# Patient Record
Sex: Female | Born: 1940 | Hispanic: No | Smoking: Never smoker
Health system: Southern US, Community
[De-identification: ages and names within clinical notes are randomized; demographics above are authoritative.]

## PROBLEM LIST (undated history)

## (undated) DIAGNOSIS — E785 Hyperlipidemia, unspecified: Secondary | ICD-10-CM

## (undated) DIAGNOSIS — I10 Essential (primary) hypertension: Secondary | ICD-10-CM

## (undated) HISTORY — DX: Essential (primary) hypertension: I10

## (undated) HISTORY — DX: Hyperlipidemia, unspecified: E78.5

---

## 1998-06-20 ENCOUNTER — Other Ambulatory Visit: Admission: RE | Admit: 1998-06-20 | Discharge: 1998-06-20 | Payer: Self-pay | Admitting: Family Medicine

## 1999-07-14 ENCOUNTER — Other Ambulatory Visit: Admission: RE | Admit: 1999-07-14 | Discharge: 1999-07-14 | Payer: Self-pay | Admitting: Family Medicine

## 2000-08-25 ENCOUNTER — Other Ambulatory Visit: Admission: RE | Admit: 2000-08-25 | Discharge: 2000-08-25 | Payer: Self-pay | Admitting: Family Medicine

## 2003-10-03 ENCOUNTER — Other Ambulatory Visit: Admission: RE | Admit: 2003-10-03 | Discharge: 2003-10-03 | Payer: Self-pay | Admitting: Family Medicine

## 2005-06-02 ENCOUNTER — Other Ambulatory Visit: Admission: RE | Admit: 2005-06-02 | Discharge: 2005-06-02 | Payer: Self-pay | Admitting: Family Medicine

## 2012-04-10 ENCOUNTER — Other Ambulatory Visit: Payer: Self-pay | Admitting: *Deleted

## 2012-05-23 ENCOUNTER — Encounter: Payer: Self-pay | Admitting: *Deleted

## 2012-06-15 ENCOUNTER — Ambulatory Visit (INDEPENDENT_AMBULATORY_CARE_PROVIDER_SITE_OTHER): Payer: Medicare Other | Admitting: Nurse Practitioner

## 2012-06-15 ENCOUNTER — Encounter: Payer: Self-pay | Admitting: Nurse Practitioner

## 2012-06-15 VITALS — BP 132/66 | HR 64 | Temp 97.7°F | Ht 65.0 in | Wt 134.0 lb

## 2012-06-15 DIAGNOSIS — E785 Hyperlipidemia, unspecified: Secondary | ICD-10-CM

## 2012-06-15 DIAGNOSIS — I10 Essential (primary) hypertension: Secondary | ICD-10-CM | POA: Insufficient documentation

## 2012-06-15 LAB — COMPLETE METABOLIC PANEL WITH GFR
ALT: 17 U/L (ref 0–35)
BUN: 13 mg/dL (ref 6–23)
CO2: 27 mEq/L (ref 19–32)
Calcium: 9.9 mg/dL (ref 8.4–10.5)
Chloride: 104 mEq/L (ref 96–112)
Creat: 0.75 mg/dL (ref 0.50–1.10)
GFR, Est African American: >89 mL/min

## 2012-06-15 NOTE — Progress Notes (Signed)
  Subjective:    Patient ID: Becky Berg, female    DOB: [birth year], 72 y.o.   MRN: [id number]  Hypertension This is a chronic problem. The current episode started more than 1 year ago. The problem is unchanged. The problem is controlled. Pertinent negatives include no blurred vision, chest pain, headaches, malaise/fatigue, palpitations, peripheral edema or shortness of breath. There are no associated agents to hypertension. Risk factors for coronary artery disease include post-menopausal state, dyslipidemia and family history. Past treatments include ACE inhibitors. The current treatment provides significant improvement. There are no compliance problems.   Hyperlipidemia This is a chronic problem. The current episode started more than 1 year ago. The problem is controlled. Recent lipid tests were reviewed and are normal. There are no known factors aggravating her hyperlipidemia. Pertinent negatives include no chest pain or shortness of breath. Current antihyperlipidemic treatment includes statins. The current treatment provides significant improvement of lipids. There are no compliance problems.  Risk factors for coronary artery disease include hypertension, post-menopausal and family history.      Review of Systems  Constitutional: Negative for malaise/fatigue.  Eyes: Negative for blurred vision.  Respiratory: Negative for shortness of breath.   Cardiovascular: Negative for chest pain and palpitations.  Neurological: Negative for headaches.  All other systems reviewed and are negative.       Objective:   Physical Exam  Constitutional: She is oriented to person, place, and time. She appears well-developed and well-nourished.  HENT:  Nose: Nose normal.  Mouth/Throat: Oropharynx is clear and moist.  Eyes: EOM are normal.  Neck: Trachea normal, normal range of motion and full passive range of motion without pain. Neck supple. No JVD present. Carotid bruit is not present. No thyromegaly  present.  Cardiovascular: Normal rate, regular rhythm, normal heart sounds and intact distal pulses.  Exam reveals no gallop and no friction rub.   No murmur heard. Pulmonary/Chest: Effort normal and breath sounds normal.  Abdominal: Soft. Bowel sounds are normal. She exhibits no distension and no mass. There is no tenderness.  Musculoskeletal: Normal range of motion.  Lymphadenopathy:    She has no cervical adenopathy.  Neurological: She is alert and oriented to person, place, and time. She has normal reflexes.  Skin: Skin is warm and dry.  Psychiatric: She has a normal mood and affect. Her behavior is normal. Judgment and thought content normal.   BP 132/66  Pulse 64  Temp(Src) 97.7 F (36.5 C) (Oral)  Ht 5\' 5"  (1.651 m)  Wt 134 lb (60.782 kg)  BMI 22.3 kg/m2        Assessment & Plan:     ICD-9-CM   1. Hypertension 401.9 COMPLETE METABOLIC PANEL WITH GFR  2. Hyperlipidemia 272.4 NMR Lipoprofile with Lipids   Continue all meds Labs pending Continue diet and excersie Follow- up in 3 months  Mary-Margaret Daphine Deutscher, FNP

## 2012-06-15 NOTE — Patient Instructions (Signed)

## 2012-06-16 ENCOUNTER — Ambulatory Visit: Payer: Self-pay | Admitting: Nurse Practitioner

## 2012-06-20 LAB — NMR LIPOPROFILE WITH LIPIDS
HDL Particle Number: 36.4 umol/L (ref >=30.5)
LDL (calc): 71 mg/dL (ref <100)
LDL Size: 20.8 nm (ref >20.5)
LP-IR Score: 26 (ref <=45)
Large HDL-P: 11.8 umol/L (ref >=4.8)
Small LDL Particle Number: 217 nmol/L (ref <=527)

## 2012-06-28 ENCOUNTER — Ambulatory Visit (INDEPENDENT_AMBULATORY_CARE_PROVIDER_SITE_OTHER): Payer: Medicare Other

## 2012-06-28 ENCOUNTER — Ambulatory Visit (INDEPENDENT_AMBULATORY_CARE_PROVIDER_SITE_OTHER): Payer: Medicare Other | Admitting: Pharmacist

## 2012-06-28 VITALS — Ht 66.0 in | Wt 134.0 lb

## 2012-06-28 DIAGNOSIS — Z780 Asymptomatic menopausal state: Secondary | ICD-10-CM

## 2012-06-28 DIAGNOSIS — Z13820 Encounter for screening for osteoporosis: Secondary | ICD-10-CM

## 2012-06-28 NOTE — Patient Instructions (Addendum)

## 2012-06-28 NOTE — Progress Notes (Signed)
Patient ID: Becky Berg, female   DOB: Jul 25, 1940, 72 y.o.   MRN: [id number] [facility] Current Height: Height: 5\' 6"  (167.6 cm)      Max Lifetime Height:  5\' 6"    Current Weight: Weight: 134 lb (60.782 kg)       Ethnicity:Caucasian     HPI: Does pt already have a diagnosis of:  Osteopenia?  No Osteoporosis?  No  Back Pain?  No       Kyphosis?  Yes - slight Prior fracture?  No Med(s) for Osteoporosis/Osteopenia:  none Med(s) previously tried for Osteoporosis/Osteopenia:  none                                                             PMH: Age at menopause:  20's Hysterectomy?  No Oophorectomy?  No HRT? Yes - Former.  Type/duration: premarin for about 1 year Steroid Use?  No Thyroid med?  No History of cancer?  No History of digestive disorders (ie Crohn's)?  No Current or previous eating disorders?  No Last Vitamin D Resuls: 50 (10/2010) Last GFR Result:  80 (06/20/2012)   FH/SH: Family history of osteoporosis?  No Parent with history of hip fracture?  No Family history of breast cancer?  Yes -sister Exercise?  No but works in her garden  Smoking?  No Alcohol?  No    Calcium Assessment Calcium Intake  # of servings/day  Calcium mg  Milk (8 oz) 2  x  300  = 600mg   Yogurt (4 oz) 0.5 x  200 = 100mg   Cheese (1 oz) 0 x  200 = 0  Other Calcium sources   250mg   Ca supplement 0 = 0   Estimated calcium intake per day 950mg     DEXA Results Date of Test T-Score for AP Spine L1-L4 T-Score for Total Left Hip T-Score for Total Right Hip  06/28/2012 -0.5 -0.4 -0.3  08/27/2009 0.1 -0.5 -0.3  05/24/2007 0.2 -0.2 -0.1  10/07/2003 -0.9 -0.3 --    Assessment: Normal BMD but decreased BMD in the spine, stable in both hips  Recommendations:  1.  recommend calcium 1200mg  daily either through supplementation   or diet.   3.  recommend weight bearing exercise - 30 minutes at least 4 days   per week.   4.  Counseled and educated about fall risk and  prevention.  Recheck DEXA:  2 years  Time spent counseling patient:  10 minutes  Henrene Pastor, PharmD, CPP

## 2012-09-11 ENCOUNTER — Telehealth: Payer: Self-pay | Admitting: Nurse Practitioner

## 2012-09-11 MED ORDER — ATORVASTATIN CALCIUM 40 MG PO TABS: 40.0000 mg | ORAL_TABLET | Freq: Every day | ORAL | Status: DC

## 2012-09-11 NOTE — Telephone Encounter (Signed)
DONE

## 2012-10-16 ENCOUNTER — Encounter: Payer: Self-pay | Admitting: Nurse Practitioner

## 2012-10-16 ENCOUNTER — Ambulatory Visit (INDEPENDENT_AMBULATORY_CARE_PROVIDER_SITE_OTHER): Payer: Medicare Other | Admitting: Nurse Practitioner

## 2012-10-16 VITALS — BP 153/75 | HR 66 | Temp 98.0°F | Ht 66.0 in | Wt 131.0 lb

## 2012-10-16 DIAGNOSIS — E785 Hyperlipidemia, unspecified: Secondary | ICD-10-CM

## 2012-10-16 DIAGNOSIS — I10 Essential (primary) hypertension: Secondary | ICD-10-CM

## 2012-10-16 MED ORDER — LISINOPRIL 40 MG PO TABS: 40.0000 mg | ORAL_TABLET | Freq: Every day | ORAL | Status: DC

## 2012-10-16 MED ORDER — ATORVASTATIN CALCIUM 40 MG PO TABS: 40.0000 mg | ORAL_TABLET | Freq: Every day | ORAL | Status: DC

## 2012-10-16 NOTE — Progress Notes (Signed)
  Subjective:    Patient ID: Becky Berg, female    DOB: July 30, 1940, 72 y.o.   MRN: [id number]  Hypertension This is a chronic problem. The current episode started more than 1 year ago. The problem is unchanged. The problem is controlled. Pertinent negatives include no blurred vision, chest pain, headaches, malaise/fatigue, palpitations, peripheral edema or shortness of breath. There are no associated agents to hypertension. Risk factors for coronary artery disease include post-menopausal state, dyslipidemia and family history. Past treatments include ACE inhibitors. The current treatment provides significant improvement. There are no compliance problems.   Hyperlipidemia This is a chronic problem. The current episode started more than 1 year ago. The problem is controlled. Recent lipid tests were reviewed and are normal. There are no known factors aggravating her hyperlipidemia. Pertinent negatives include no chest pain or shortness of breath. Current antihyperlipidemic treatment includes statins. The current treatment provides significant improvement of lipids. There are no compliance problems.  Risk factors for coronary artery disease include hypertension, post-menopausal and family history.      Review of Systems  Constitutional: Negative for malaise/fatigue.  Eyes: Negative for blurred vision.  Respiratory: Negative for shortness of breath.   Cardiovascular: Negative for chest pain and palpitations.  Neurological: Negative for headaches.  All other systems reviewed and are negative.       Objective:   Physical Exam  Constitutional: She is oriented to person, place, and time. She appears well-developed and well-nourished.  HENT:  Nose: Nose normal.  Mouth/Throat: Oropharynx is clear and moist.  Eyes: EOM are normal.  Neck: Trachea normal, normal range of motion and full passive range of motion without pain. Neck supple. No JVD present. Carotid bruit is not present. No thyromegaly  present.  Cardiovascular: Normal rate, regular rhythm, normal heart sounds and intact distal pulses.  Exam reveals no gallop and no friction rub.   No murmur heard. Pulmonary/Chest: Effort normal and breath sounds normal.  Abdominal: Soft. Bowel sounds are normal. She exhibits no distension and no mass. There is no tenderness.  Musculoskeletal: Normal range of motion.  Lymphadenopathy:    She has no cervical adenopathy.  Neurological: She is alert and oriented to person, place, and time. She has normal reflexes.  Skin: Skin is warm and dry.  Psychiatric: She has a normal mood and affect. Her behavior is normal. Judgment and thought content normal.   BP 153/75  Pulse 66  Temp(Src) 98 F (36.7 C) (Oral)  Ht 5\' 6"  (1.676 m)  Wt 131 lb (59.421 kg)  BMI 21.15 kg/m2        Assessment & Plan:   1. Hypertension   2. Hyperlipidemia    Orders Placed This Encounter  Procedures  . CMP14+EGFR  . NMR, lipoprofile   Meds ordered this encounter  Medications  . lisinopril (PRINIVIL,ZESTRIL) 40 MG tablet    Sig: Take 1 tablet (40 mg total) by mouth daily.    Dispense:  90 tablet    Refill:  1  . atorvastatin (LIPITOR) 40 MG tablet    Sig: Take 1 tablet (40 mg total) by mouth daily.    Dispense:  90 tablet    Refill:  1    Continue all meds Labs pending Continue diet and excersie Follow- up in 3 months  Mary-Margaret Daphine Deutscher, FNP

## 2012-10-16 NOTE — Patient Instructions (Signed)

## 2012-10-18 LAB — CMP14+EGFR
ALT: 15 IU/L (ref 0–32)
AST: 19 IU/L (ref 0–40)
Albumin/Globulin Ratio: 2.3 (ref 1.1–2.5)
Alkaline Phosphatase: 63 IU/L (ref 39–117)
BUN/Creatinine Ratio: 16 (ref 11–26)
GFR calc Af Amer: 85 mL/min/{1.73_m2} (ref >59)
GFR calc non Af Amer: 73 mL/min/{1.73_m2} (ref >59)
Potassium: 4.8 mmol/L (ref 3.5–5.2)
Sodium: 141 mmol/L (ref 134–144)
Total Bilirubin: 0.5 mg/dL (ref 0.0–1.2)

## 2012-10-18 LAB — NMR, LIPOPROFILE
Cholesterol: 176 mg/dL (ref <200)
HDL Particle Number: 34.7 umol/L (ref >=30.5)
LDL Size: 20.9 nm (ref >20.5)
LDLC SERPL CALC-MCNC: 83 mg/dL (ref <100)
LP-IR Score: 46 — ABNORMAL HIGH (ref <=45)
Small LDL Particle Number: 388 nmol/L (ref <=527)
Triglycerides by NMR: 141 mg/dL (ref <150)

## 2013-04-25 ENCOUNTER — Telehealth: Payer: Self-pay | Admitting: Family Medicine

## 2013-04-26 NOTE — Telephone Encounter (Signed)
appt given for monday

## 2013-04-30 ENCOUNTER — Ambulatory Visit (INDEPENDENT_AMBULATORY_CARE_PROVIDER_SITE_OTHER): Payer: Medicare Other | Admitting: Nurse Practitioner

## 2013-04-30 ENCOUNTER — Encounter: Payer: Self-pay | Admitting: Nurse Practitioner

## 2013-04-30 VITALS — BP 148/76 | HR 74 | Temp 96.6°F | Ht 66.0 in | Wt 137.0 lb

## 2013-04-30 DIAGNOSIS — N812 Incomplete uterovaginal prolapse: Secondary | ICD-10-CM

## 2013-04-30 DIAGNOSIS — N814 Uterovaginal prolapse, unspecified: Secondary | ICD-10-CM

## 2013-04-30 NOTE — Progress Notes (Signed)
   Subjective:    Patient ID: Becky Berg, female    DOB: September 18, 1940, 73 y.o.   MRN: [id number]  HPI Patient said last week she noticed she was loosing blood from private area- [place] to [place] in [place]- They said that she has bladder and uterine prolapse. SH ehas not sen anymore blood since then.    Review of Systems  Constitutional: Negative.   HENT: Negative.   Respiratory: Negative.   Cardiovascular: Negative.   Genitourinary: Positive for vaginal bleeding.  Neurological: Negative.   Psychiatric/Behavioral: Negative.   All other systems reviewed and are negative.       Objective:   Physical Exam  Constitutional: She appears well-developed and well-nourished.  Cardiovascular: Normal rate, regular rhythm and normal heart sounds.   Pulmonary/Chest: Effort normal and breath sounds normal.  Abdominal: Soft. Bowel sounds are normal.  Genitourinary:  Uterine prolapse- station 2 No bleeding noted  Neurological: She is alert.  Skin: Skin is warm.    BP 148/76  Pulse 74  Temp(Src) 96.6 F (35.9 C) (Oral)  Ht 5\' 6"  (1.676 m)  Wt 137 lb (62.143 kg)  BMI 22.12 kg/m2       Assessment & Plan:   1. Second degree uterine prolapse    Discussed options with patient- decided to just watch for now Bleeding was coming from rectum- small internal hemorrhoids Needs to make appointment for regular follow up  Mary-Margaret Daphine DeutscherMartin, FNP

## 2013-05-07 ENCOUNTER — Encounter: Payer: Self-pay | Admitting: Nurse Practitioner

## 2013-05-07 ENCOUNTER — Ambulatory Visit (INDEPENDENT_AMBULATORY_CARE_PROVIDER_SITE_OTHER): Payer: Medicare Other | Admitting: Nurse Practitioner

## 2013-05-07 VITALS — BP 143/74 | HR 64 | Temp 98.4°F | Ht 66.0 in | Wt 137.0 lb

## 2013-05-07 DIAGNOSIS — E785 Hyperlipidemia, unspecified: Secondary | ICD-10-CM

## 2013-05-07 DIAGNOSIS — I10 Essential (primary) hypertension: Secondary | ICD-10-CM

## 2013-05-07 MED ORDER — LISINOPRIL 40 MG PO TABS: 40.0000 mg | ORAL_TABLET | Freq: Every day | ORAL | Status: DC

## 2013-05-07 MED ORDER — ATORVASTATIN CALCIUM 40 MG PO TABS: 40.0000 mg | ORAL_TABLET | Freq: Every day | ORAL | Status: DC

## 2013-05-07 NOTE — Patient Instructions (Signed)

## 2013-05-07 NOTE — Progress Notes (Signed)
Subjective:    Patient ID: Collins Scotlandolly W Kluender, female    DOB: [birth year], 73 y.o.   MRN: [id number]  Patient here today for follow up of chronic medical problems.  Hypertension This is a chronic problem. The current episode started more than 1 year ago. The problem is unchanged. The problem is controlled. Pertinent negatives include no blurred vision, chest pain, headaches, malaise/fatigue, palpitations, peripheral edema or shortness of breath. There are no associated agents to hypertension. Risk factors for coronary artery disease include post-menopausal state, dyslipidemia and family history. Past treatments include ACE inhibitors. The current treatment provides significant improvement. There are no compliance problems.   Hyperlipidemia This is a chronic problem. The current episode started more than 1 year ago. The problem is controlled. Recent lipid tests were reviewed and are normal. There are no known factors aggravating her hyperlipidemia. Pertinent negatives include no chest pain or shortness of breath. Current antihyperlipidemic treatment includes statins. The current treatment provides significant improvement of lipids. There are no compliance problems.  Risk factors for coronary artery disease include hypertension, post-menopausal and family history.      Review of Systems  Constitutional: Negative for malaise/fatigue.  Eyes: Negative for blurred vision.  Respiratory: Negative for shortness of breath.   Cardiovascular: Negative for chest pain and palpitations.  Neurological: Negative for headaches.  All other systems reviewed and are negative.      Objective:   Physical Exam  Constitutional: She is oriented to person, place, and time. She appears well-developed and well-nourished.  HENT:  Nose: Nose normal.  Mouth/Throat: Oropharynx is clear and moist.  Eyes: EOM are normal.  Neck: Trachea normal, normal range of motion and full passive range of motion without pain. Neck  supple. No JVD present. Carotid bruit is not present. No thyromegaly present.  Cardiovascular: Normal rate, regular rhythm, normal heart sounds and intact distal pulses.  Exam reveals no gallop and no friction rub.   No murmur heard. Pulmonary/Chest: Effort normal and breath sounds normal.  Abdominal: Soft. Bowel sounds are normal. She exhibits no distension and no mass. There is no tenderness.  Musculoskeletal: Normal range of motion.  Lymphadenopathy:    She has no cervical adenopathy.  Neurological: She is alert and oriented to person, place, and time. She has normal reflexes.  Skin: Skin is warm and dry.  Psychiatric: She has a normal mood and affect. Her behavior is normal. Judgment and thought content normal.   BP 143/74  Pulse 64  Temp(Src) 98.4 F (36.9 C) (Oral)  Ht 5\' 6"  (1.676 m)  Wt 137 lb (62.143 kg)  BMI 22.12 kg/m2        Assessment & Plan:    1. Hypertension   2. Hyperlipidemia    Orders Placed This Encounter  Procedures  . CMP14+EGFR  . NMR, lipoprofile   Meds ordered this encounter  Medications  . atorvastatin (LIPITOR) 40 MG tablet    Sig: Take 1 tablet (40 mg total) by mouth daily.    Dispense:  90 tablet    Refill:  1    Order Specific Question:  Supervising Provider    Answer:  Ernestina PennaMOORE, DONALD W [[id number]]  . lisinopril (PRINIVIL,ZESTRIL) 40 MG tablet    Sig: Take 1 tablet (40 mg total) by mouth daily.    Dispense:  90 tablet    Refill:  1    Order Specific Question:  Supervising Provider    Answer:  Ernestina PennaMOORE, DONALD W [[id number]]    Labs pending Health maintenance reviewed  Diet and exercise encouraged Continue all meds Follow up  In 3 months  [place], FNP

## 2013-05-08 LAB — CMP14+EGFR
ALBUMIN: 4.4 g/dL (ref 3.5–4.8)
ALK PHOS: 62 IU/L (ref 39–117)
ALT: 103 IU/L — ABNORMAL HIGH (ref 0–32)
AST: 69 IU/L — ABNORMAL HIGH (ref 0–40)
Albumin/Globulin Ratio: 1.8 (ref 1.1–2.5)
BUN / CREAT RATIO: 21 (ref 11–26)
BUN: 15 mg/dL (ref 8–27)
CHLORIDE: 100 mmol/L (ref 97–108)
CO2: 26 mmol/L (ref 18–29)
CREATININE: 0.72 mg/dL (ref 0.57–1.00)
Calcium: 9.4 mg/dL (ref 8.7–10.3)
GFR calc non Af Amer: 84 mL/min/{1.73_m2} (ref >59)
GFR, EST AFRICAN AMERICAN: 97 mL/min/{1.73_m2} (ref >59)
GLOBULIN, TOTAL: 2.5 g/dL (ref 1.5–4.5)
GLUCOSE: 89 mg/dL (ref 65–99)
Potassium: 4.8 mmol/L (ref 3.5–5.2)
Sodium: 141 mmol/L (ref 134–144)
TOTAL PROTEIN: 6.9 g/dL (ref 6.0–8.5)
Total Bilirubin: 0.5 mg/dL (ref 0.0–1.2)

## 2013-05-08 LAB — NMR, LIPOPROFILE
CHOLESTEROL: 173 mg/dL (ref <200)
HDL Cholesterol by NMR: 70 mg/dL (ref >=40)
HDL PARTICLE NUMBER: 36.5 umol/L (ref >=30.5)
LDL PARTICLE NUMBER: 966 nmol/L (ref <1000)
LDL Size: 20.7 nm (ref >20.5)
LDLC SERPL CALC-MCNC: 85 mg/dL (ref <100)
LP-IR SCORE: 25 (ref <=45)
Small LDL Particle Number: 376 nmol/L (ref <=527)
TRIGLYCERIDES BY NMR: 90 mg/dL (ref <150)

## 2013-08-03 LAB — HM MAMMOGRAPHY

## 2013-09-03 ENCOUNTER — Ambulatory Visit (INDEPENDENT_AMBULATORY_CARE_PROVIDER_SITE_OTHER): Payer: Medicare Other | Admitting: Nurse Practitioner

## 2013-09-03 ENCOUNTER — Ambulatory Visit (INDEPENDENT_AMBULATORY_CARE_PROVIDER_SITE_OTHER): Payer: Medicare Other

## 2013-09-03 ENCOUNTER — Encounter: Payer: Self-pay | Admitting: Nurse Practitioner

## 2013-09-03 VITALS — BP 144/77 | HR 74 | Temp 97.6°F | Ht 66.0 in | Wt 136.4 lb

## 2013-09-03 DIAGNOSIS — Z9189 Other specified personal risk factors, not elsewhere classified: Secondary | ICD-10-CM

## 2013-09-03 DIAGNOSIS — E785 Hyperlipidemia, unspecified: Secondary | ICD-10-CM

## 2013-09-03 DIAGNOSIS — Z7722 Contact with and (suspected) exposure to environmental tobacco smoke (acute) (chronic): Secondary | ICD-10-CM

## 2013-09-03 DIAGNOSIS — I10 Essential (primary) hypertension: Secondary | ICD-10-CM

## 2013-09-03 MED ORDER — LISINOPRIL 40 MG PO TABS: 40.0000 mg | ORAL_TABLET | Freq: Every day | ORAL | Status: DC

## 2013-09-03 MED ORDER — ATORVASTATIN CALCIUM 40 MG PO TABS: 40.0000 mg | ORAL_TABLET | Freq: Every day | ORAL | Status: DC

## 2013-09-03 NOTE — Progress Notes (Signed)
Subjective:    Patient ID: Becky Berg, female    DOB: Feb 06, 1940, 73 y.o.   MRN: [id number]  Patient here today for follow up of chronic medical problems. No compliants today.  Hypertension This is a chronic problem. The current episode started more than 1 year ago. The problem is unchanged. The problem is controlled. Pertinent negatives include no blurred vision, chest pain, headaches, malaise/fatigue, palpitations, peripheral edema or shortness of breath. There are no associated agents to hypertension. Risk factors for coronary artery disease include post-menopausal state, dyslipidemia and family history. Past treatments include ACE inhibitors. The current treatment provides significant improvement. There are no compliance problems.   Hyperlipidemia This is a chronic problem. The current episode started more than 1 year ago. The problem is controlled. Recent lipid tests were reviewed and are normal. There are no known factors aggravating her hyperlipidemia. Pertinent negatives include no chest pain or shortness of breath. Current antihyperlipidemic treatment includes statins. The current treatment provides significant improvement of lipids. There are no compliance problems.  Risk factors for coronary artery disease include hypertension, post-menopausal and family history.      Review of Systems  Constitutional: Negative for malaise/fatigue.  Eyes: Negative for blurred vision.  Respiratory: Negative for shortness of breath.   Cardiovascular: Negative for chest pain and palpitations.  Neurological: Negative for headaches.  All other systems reviewed and are negative.      Objective:   Physical Exam  Constitutional: She is oriented to person, place, and time. She appears well-developed and well-nourished.  HENT:  Nose: Nose normal.  Mouth/Throat: Oropharynx is clear and moist.  Eyes: EOM are normal.  Neck: Trachea normal, normal range of motion and full passive range of motion  without pain. Neck supple. No JVD present. Carotid bruit is not present. No thyromegaly present.  Cardiovascular: Normal rate, regular rhythm, normal heart sounds and intact distal pulses.  Exam reveals no gallop and no friction rub.   No murmur heard. Pulmonary/Chest: Effort normal and breath sounds normal.  Abdominal: Soft. Bowel sounds are normal. She exhibits no distension and no mass. There is no tenderness.  Musculoskeletal: Normal range of motion.  Lymphadenopathy:    She has no cervical adenopathy.  Neurological: She is alert and oriented to person, place, and time. She has normal reflexes.  Skin: Skin is warm and dry.  Psychiatric: She has a normal mood and affect. Her behavior is normal. Judgment and thought content normal.   BP 144/77  Pulse 74  Temp(Src) 97.6 F (36.4 C) (Oral)  Ht _0  (1.676 m)  Wt 136 lb 6.4 oz (61.871 kg)  BMI 22.03 kg/m2 Chest x ray- Chronic bronchitic changes-Preliminary reading by Ronnald Collum, FNP  [facility]  EKG- Kerry Hough, FNP       Assessment & Plan:   1. Essential hypertension   2. Hyperlipidemia    Orders Placed This Encounter  Procedures  . CMP14+EGFR  . NMR, lipoprofile   Meds ordered this encounter  Medications  . atorvastatin (LIPITOR) 40 MG tablet    Sig: Take 1 tablet (40 mg total) by mouth daily.    Dispense:  90 tablet    Refill:  1    Order Specific Question:  Supervising Provider    Answer:  Chipper Herb [[id number]]  . lisinopril (PRINIVIL,ZESTRIL) 40 MG tablet    Sig: Take 1 tablet (40 mg total) by mouth daily.    Dispense:  90 tablet    Refill:  1    Order  Specific Question:  Supervising Provider    Answer:  Chipper Herb [[id number]]   hemoccult cards given to patient with directions Labs pending Health maintenance reviewed Diet and exercise encouraged Continue all meds Follow up  In 3 months   [place], FNP

## 2013-09-03 NOTE — Patient Instructions (Signed)

## 2013-09-04 LAB — CMP14+EGFR
A/G RATIO: 1.8 (ref 1.1–2.5)
ALK PHOS: 61 IU/L (ref 39–117)
ALT: 112 IU/L — AB (ref 0–32)
AST: 83 IU/L — AB (ref 0–40)
Albumin: 4.4 g/dL (ref 3.5–4.8)
BUN / CREAT RATIO: 16 (ref 11–26)
BUN: 12 mg/dL (ref 8–27)
CO2: 25 mmol/L (ref 18–29)
CREATININE: 0.73 mg/dL (ref 0.57–1.00)
Calcium: 9.7 mg/dL (ref 8.7–10.3)
Chloride: 99 mmol/L (ref 97–108)
GFR calc Af Amer: 95 mL/min/{1.73_m2} (ref >59)
GFR calc non Af Amer: 83 mL/min/{1.73_m2} (ref >59)
GLOBULIN, TOTAL: 2.5 g/dL (ref 1.5–4.5)
Glucose: 99 mg/dL (ref 65–99)
POTASSIUM: 4.8 mmol/L (ref 3.5–5.2)
SODIUM: 142 mmol/L (ref 134–144)
Total Bilirubin: 0.5 mg/dL (ref 0.0–1.2)
Total Protein: 6.9 g/dL (ref 6.0–8.5)

## 2013-09-04 LAB — NMR, LIPOPROFILE
Cholesterol: 177 mg/dL (ref 100–199)
HDL Cholesterol by NMR: 74 mg/dL (ref >39)
HDL PARTICLE NUMBER: 38.8 umol/L (ref >=30.5)
LDL Particle Number: 730 nmol/L (ref <1000)
LDL Size: 21.1 nm (ref >20.5)
LDLC SERPL CALC-MCNC: 85 mg/dL (ref 0–99)
LP-IR Score: 27 (ref <=45)
Triglycerides by NMR: 89 mg/dL (ref 0–149)

## 2013-11-07 ENCOUNTER — Telehealth: Payer: Self-pay | Admitting: Nurse Practitioner

## 2013-11-07 DIAGNOSIS — I10 Essential (primary) hypertension: Secondary | ICD-10-CM

## 2013-11-07 DIAGNOSIS — E785 Hyperlipidemia, unspecified: Secondary | ICD-10-CM

## 2013-11-08 MED ORDER — ATORVASTATIN CALCIUM 40 MG PO TABS: 40.0000 mg | ORAL_TABLET | Freq: Every day | ORAL | Status: AC

## 2013-11-08 MED ORDER — LISINOPRIL 40 MG PO TABS: 40.0000 mg | ORAL_TABLET | Freq: Every day | ORAL | Status: AC

## 2013-11-08 NOTE — Telephone Encounter (Signed)
rx snet

## 2013-12-13 ENCOUNTER — Encounter: Payer: Self-pay | Admitting: Nurse Practitioner

## 2013-12-13 ENCOUNTER — Telehealth: Payer: Self-pay | Admitting: *Deleted

## 2013-12-13 ENCOUNTER — Ambulatory Visit (INDEPENDENT_AMBULATORY_CARE_PROVIDER_SITE_OTHER): Payer: Medicare Other | Admitting: Nurse Practitioner

## 2013-12-13 VITALS — BP 148/78 | HR 90 | Temp 98.4°F | Ht 66.0 in | Wt 138.0 lb

## 2013-12-13 DIAGNOSIS — E785 Hyperlipidemia, unspecified: Secondary | ICD-10-CM

## 2013-12-13 DIAGNOSIS — I10 Essential (primary) hypertension: Secondary | ICD-10-CM

## 2013-12-13 DIAGNOSIS — J209 Acute bronchitis, unspecified: Secondary | ICD-10-CM

## 2013-12-13 MED ORDER — BENZONATATE 100 MG PO CAPS: 100.0000 mg | ORAL_CAPSULE | Freq: Two times a day (BID) | ORAL | Status: AC | PRN

## 2013-12-13 MED ORDER — PREDNISONE 20 MG PO TABS: 20.0000 mg | ORAL_TABLET | Freq: Every day | ORAL | Status: AC

## 2013-12-13 NOTE — Patient Instructions (Signed)
Cough, Adult  A cough is a reflex that helps clear your throat and airways. It can help heal the body or may be a reaction to an irritated airway. A cough may only last 2 or 3 weeks (acute) or may last more than 8 weeks (chronic).  CAUSES Acute cough:  Viral or bacterial infections. Chronic cough:  Infections.  Allergies.  Asthma.  Post-nasal drip.  Smoking.  Heartburn or acid reflux.  Some medicines.  Chronic lung problems (COPD).  Cancer. SYMPTOMS   Cough.  Fever.  Chest pain.  Increased breathing rate.  High-pitched whistling sound when breathing (wheezing).  Colored mucus that you cough up (sputum). TREATMENT   A bacterial cough may be treated with antibiotic medicine.  A viral cough must run its course and will not respond to antibiotics.  Your caregiver may recommend other treatments if you have a chronic cough. HOME CARE INSTRUCTIONS   Only take over-the-counter or prescription medicines for pain, discomfort, or fever as directed by your caregiver. Use cough suppressants only as directed by your caregiver.  Use a cold steam vaporizer or humidifier in your bedroom or home to help loosen secretions.  Sleep in a semi-upright position if your cough is worse at night.  Rest as needed.  Stop smoking if you smoke. SEEK IMMEDIATE MEDICAL CARE IF:   You have pus in your sputum.  Your cough starts to worsen.  You cannot control your cough with suppressants and are losing sleep.  You begin coughing up blood.  You have difficulty breathing.  You develop pain which is getting worse or is uncontrolled with medicine.  You have a fever. MAKE SURE YOU:   Understand these instructions.  Will watch your condition.  Will get help right away if you are not doing well or get worse. Document Released: 07/10/2010 Document Revised: 04/05/2011 Document Reviewed: 07/10/2010 Exit[employer] Patient Information 2015 [employer]. This information is not intended  to replace advice given to you by your health care provider. Make sure you discuss any questions you have with your health care provider.  

## 2013-12-13 NOTE — Progress Notes (Signed)
Subjective:    Patient ID: Becky Berg, female    DOB: [birth year], 73 y.o.   MRN: [id number]  Patient here today for follow up of chronic medical problems.  C/O cough and congestion that started at least 1 month ago- gets better then comes right back- started this last time 2 days ago and cannot quit coughing.  Hypertension This is a chronic problem. The current episode started more than 1 year ago. The problem is unchanged. The problem is controlled. Pertinent negatives include no chest pain, headaches, palpitations or shortness of breath. Risk factors for coronary artery disease include dyslipidemia and post-menopausal state. Past treatments include ACE inhibitors. The current treatment provides moderate improvement. Compliance problems include diet and exercise.   Hyperlipidemia The current episode started more than 1 year ago. The problem is controlled. Recent lipid tests were reviewed and are normal. She has no history of diabetes, hypothyroidism or obesity. Pertinent negatives include no chest pain or shortness of breath. Current antihyperlipidemic treatment includes statins. The current treatment provides moderate improvement of lipids. Compliance problems include adherence to diet and adherence to exercise.  Risk factors for coronary artery disease include dyslipidemia, hypertension and post-menopausal.      Review of Systems  Constitutional: Negative.  Negative for fever, chills and appetite change.  HENT: Positive for congestion. Negative for sinus pressure, sneezing, sore throat and trouble swallowing.   Respiratory: Positive for cough (dry cough). Negative for shortness of breath.   Cardiovascular: Negative for chest pain and palpitations.  Gastrointestinal: Negative.   Genitourinary: Negative.   Musculoskeletal: Negative.   Neurological: Negative for headaches.  Psychiatric/Behavioral: Negative.   All other systems reviewed and are negative.      Objective:   Physical  Exam  Constitutional: She is oriented to person, place, and time. She appears well-developed and well-nourished.  HENT:  Nose: Nose normal.  Mouth/Throat: Oropharynx is clear and moist.  Eyes: EOM are normal.  Neck: Trachea normal, normal range of motion and full passive range of motion without pain. Neck supple. No JVD present. Carotid bruit is not present. No thyromegaly present.  Cardiovascular: Normal rate, regular rhythm, normal heart sounds and intact distal pulses.  Exam reveals no gallop and no friction rub.   No murmur heard. Pulmonary/Chest: Effort normal and breath sounds normal. She has no wheezes. She has no rales.  Dry hacky cough  Abdominal: Soft. Bowel sounds are normal. She exhibits no distension and no mass. There is no tenderness.  Musculoskeletal: Normal range of motion.  Lymphadenopathy:    She has no cervical adenopathy.  Neurological: She is alert and oriented to person, place, and time. She has normal reflexes.  Skin: Skin is warm and dry.  Psychiatric: She has a normal mood and affect. Her behavior is normal. Judgment and thought content normal.   BP 148/78 mmHg  Pulse 90  Temp(Src) 98.4 F (36.9 C) (Oral)  Ht $R'5\' 6"'[place]$  (1.676 m)  Wt 138 lb (62.596 kg)  BMI 22.28 kg/m2       Assessment & Plan:     1. Essential hypertension Do not add salt to det - CMP14+EGFR  2. Hyperlipidemia Low fat diet - NMR, lipoprofile  3. Acute bronchitis, unspecified organism Force fluids rest - predniSONE (DELTASONE) 20 MG tablet; Take 1 tablet (20 mg total) by mouth daily with breakfast.  Dispense: 10 tablet; Refill: 0 - benzonatate (TESSALON) 100 MG capsule; Take 1 capsule (100 mg total) by mouth 2 (two) times daily as needed for cough.  Dispense: 20 capsule; Refill: 0    Labs pending Health maintenance reviewed Diet and exercise encouraged Continue all meds Follow up  In 3 months   [place], FNP

## 2013-12-13 NOTE — Telephone Encounter (Signed)
Called patient to come in to pick up FOBT kit and have it done at no charge due to lab error

## 2013-12-14 ENCOUNTER — Telehealth: Payer: Self-pay

## 2013-12-14 LAB — NMR, LIPOPROFILE
Cholesterol: 185 mg/dL (ref 100–199)
HDL Cholesterol by NMR: 82 mg/dL (ref >39)
HDL Particle Number: 37.2 umol/L (ref >=30.5)
LDL Particle Number: 953 nmol/L (ref <1000)
LDL SIZE: 21.1 nm (ref >20.5)
LDL-C: 88 mg/dL (ref 0–99)
LP-IR Score: <25 (ref <=45)
TRIGLYCERIDES BY NMR: 75 mg/dL (ref 0–149)

## 2013-12-14 LAB — CMP14+EGFR
A/G RATIO: 1.7 (ref 1.1–2.5)
ALK PHOS: 66 IU/L (ref 39–117)
ALT: 53 IU/L — AB (ref 0–32)
AST: 48 IU/L — ABNORMAL HIGH (ref 0–40)
Albumin: 4.6 g/dL (ref 3.5–4.8)
BILIRUBIN TOTAL: 0.7 mg/dL (ref 0.0–1.2)
BUN / CREAT RATIO: 13 (ref 11–26)
BUN: 11 mg/dL (ref 8–27)
CO2: 27 mmol/L (ref 18–29)
Calcium: 9.6 mg/dL (ref 8.7–10.3)
Chloride: 97 mmol/L (ref 97–108)
Creatinine, Ser: 0.83 mg/dL (ref 0.57–1.00)
GFR calc non Af Amer: 70 mL/min/{1.73_m2} (ref >59)
GFR, EST AFRICAN AMERICAN: 81 mL/min/{1.73_m2} (ref >59)
GLUCOSE: 131 mg/dL — AB (ref 65–99)
Globulin, Total: 2.7 g/dL (ref 1.5–4.5)
POTASSIUM: 4.4 mmol/L (ref 3.5–5.2)
SODIUM: 138 mmol/L (ref 134–144)
TOTAL PROTEIN: 7.3 g/dL (ref 6.0–8.5)

## 2013-12-14 NOTE — Telephone Encounter (Signed)
Pt aware of results 

## 2013-12-14 NOTE — Telephone Encounter (Signed)
-----   Message from [facility], FNP sent at 12/14/2013 12:37 PM EST ----- Kidney and liver function stable Blood sugar elevated - please add HGBA1c Cholesterol looks good Continue current meds- low fat diet and exercise and recheck in 3 months

## 2014-08-26 ENCOUNTER — Encounter: Payer: Self-pay | Admitting: *Deleted

## 2014-09-10 ENCOUNTER — Telehealth: Payer: Self-pay | Admitting: *Deleted

## 2014-09-10 NOTE — Telephone Encounter (Signed)
Left Message for patient to call back regarding follow up appointment on elevated BP

## 2016-05-21 IMAGING — CR DG CHEST 2V
2 series · 2 of 2 positions shown · non-contrast
Comparison: None.

CLINICAL DATA: Hypertension and hyperlipidemia and second hand
smoke exposure ; physical examination

EXAM:
CHEST  2 VIEW

[view not recorded (1 of 2)]
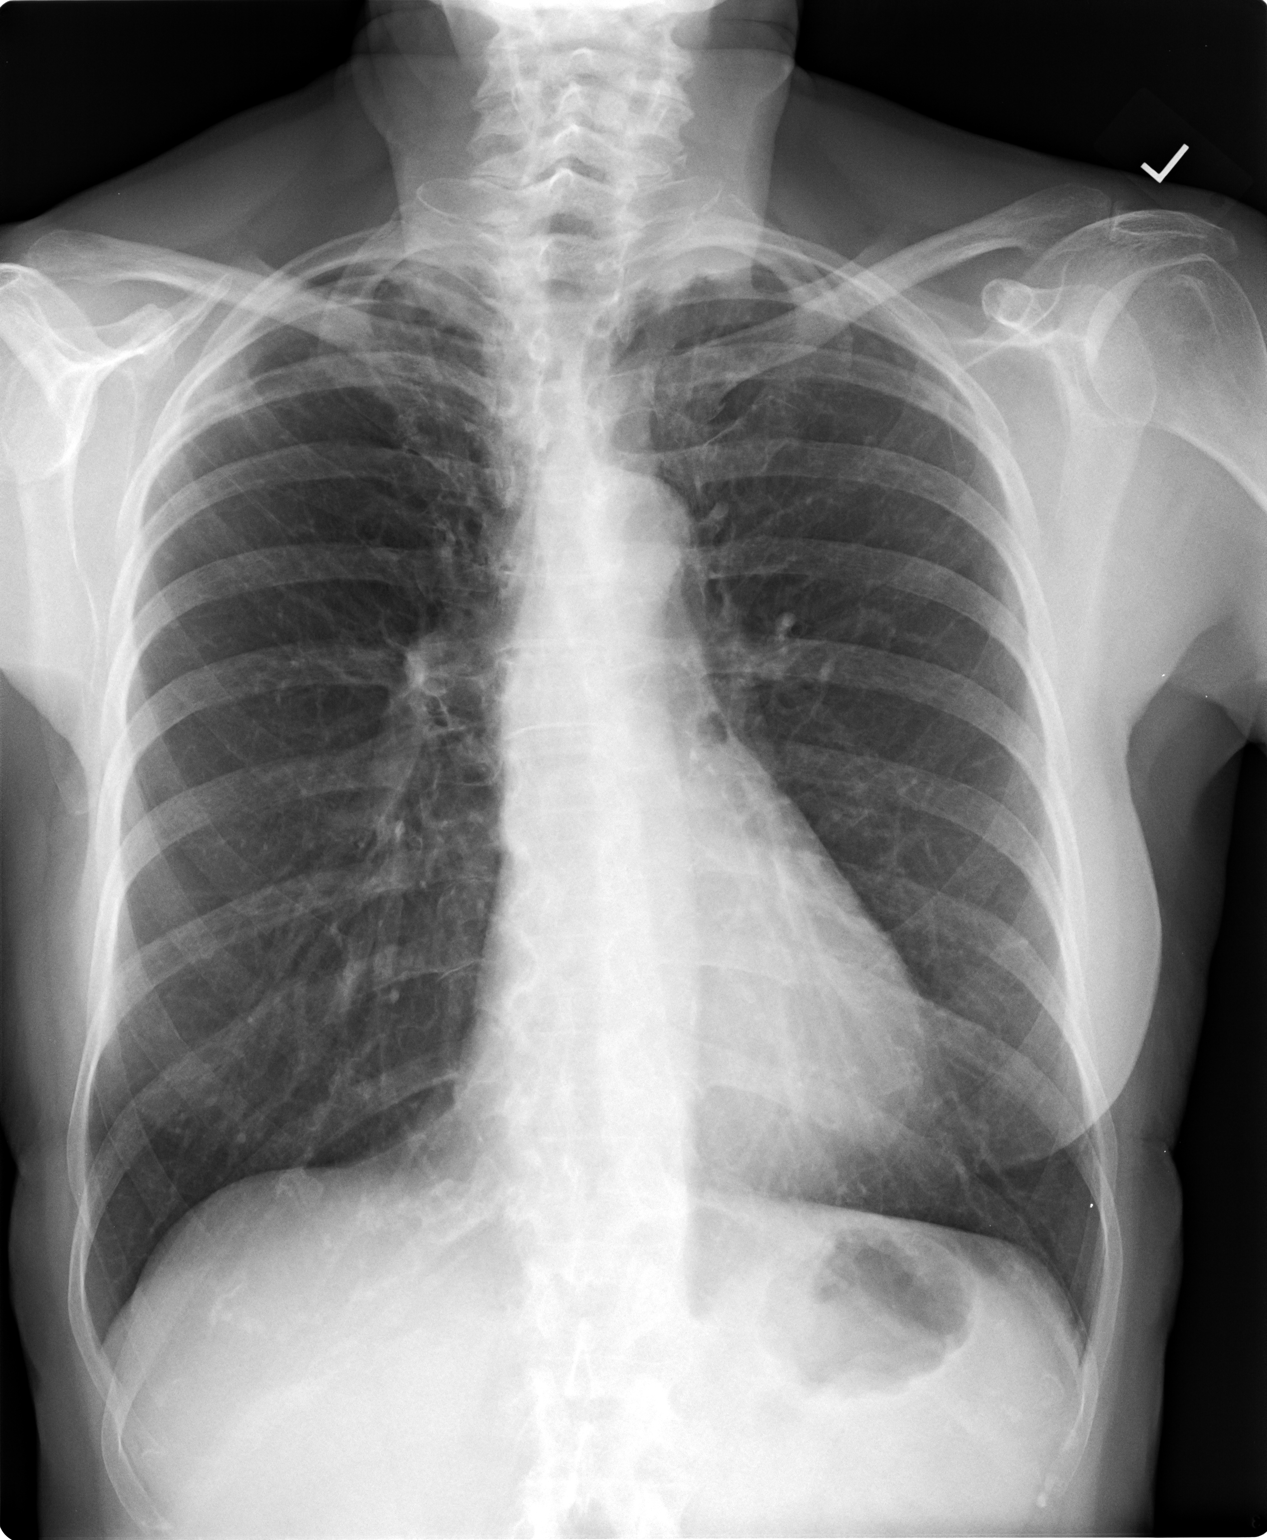

[view not recorded (2 of 2)]
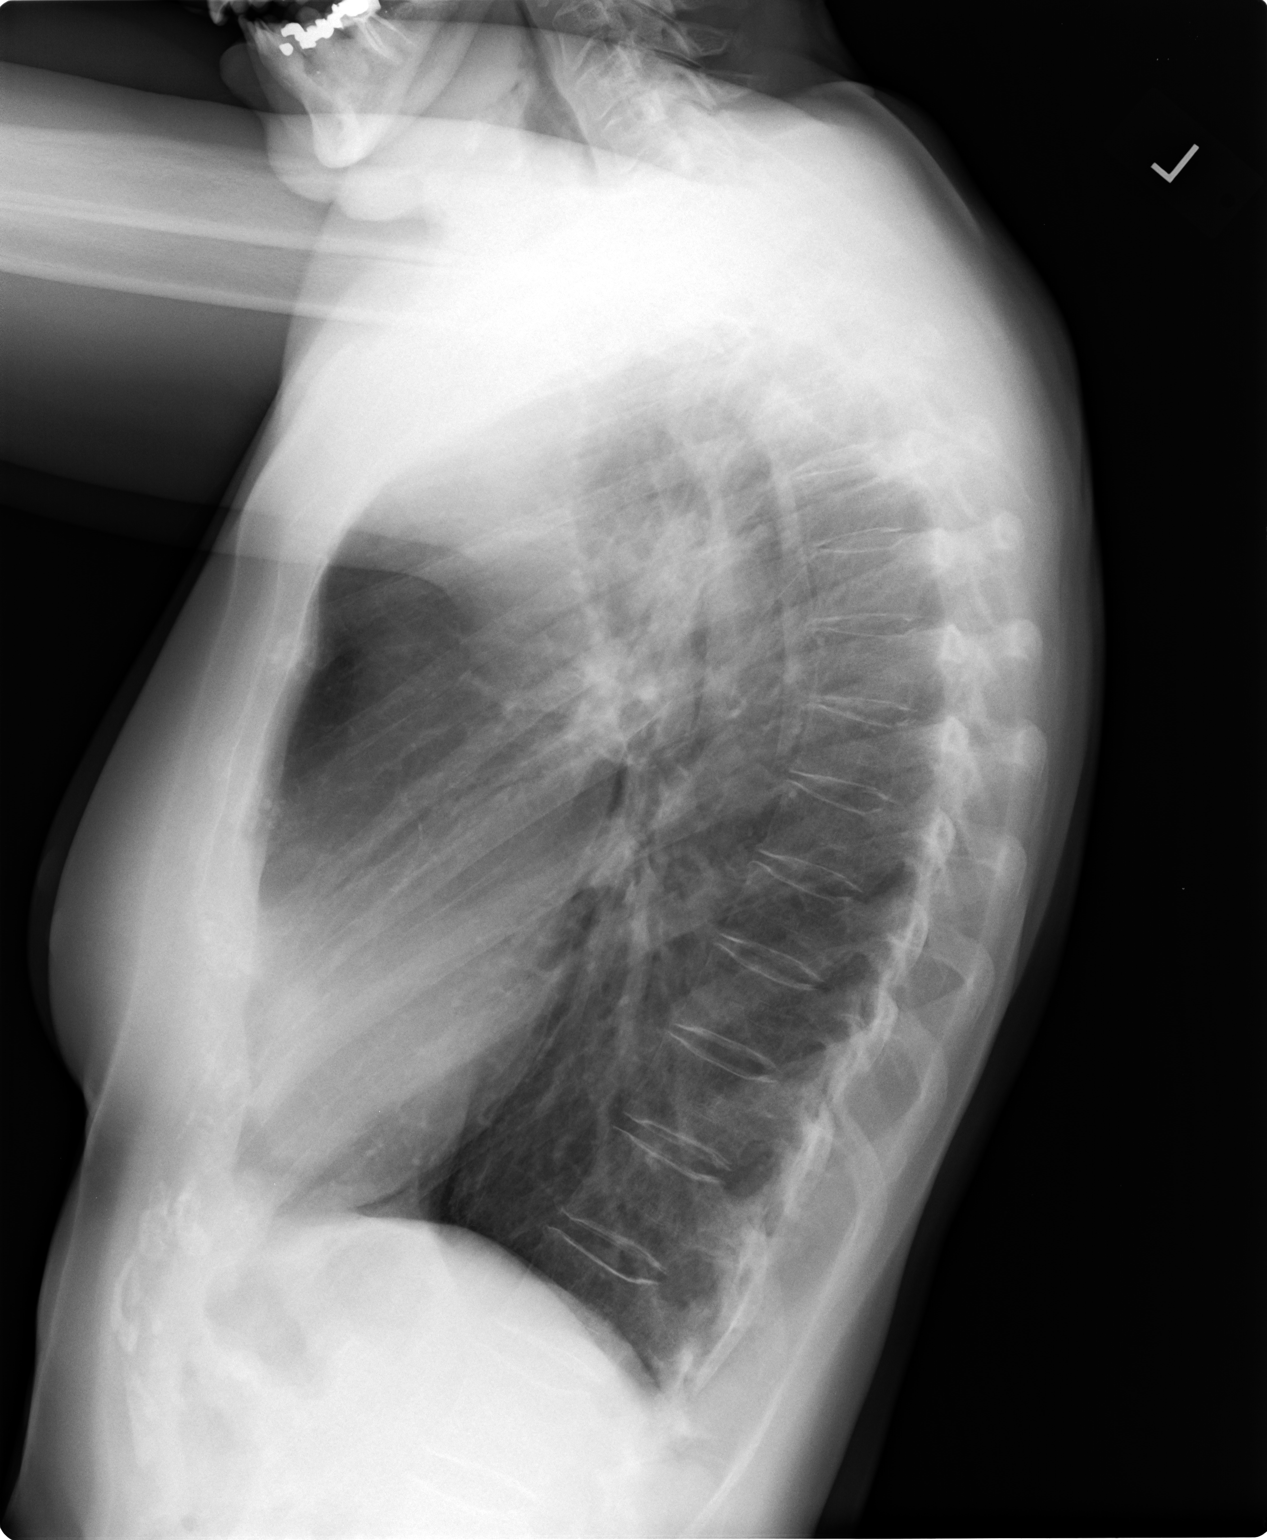

[2 of 2 positions shown; findings below may reference images not displayed]

FINDINGS: The lungs are mildly hyperinflated and clear. There is apical
pleural thickening bilaterally. The heart and pulmonary vascularity
are unremarkable. The bony thorax is unremarkable.
IMPRESSION: Hyperinflation consistent with COPD. Apical pleural scarring likely
reflects previous granulomatous infection. There is no evidence of
pneumonia nor CHF.
